# Patient Record
Sex: Male | Born: 2000 | Race: Black or African American | Hispanic: No | Marital: Single | Smoking: Never smoker
Health system: Southern US, Community
[De-identification: ages and names within clinical notes are randomized; demographics above are authoritative.]

## PROBLEM LIST (undated history)

## (undated) DIAGNOSIS — R569 Unspecified convulsions: Secondary | ICD-10-CM

## (undated) HISTORY — DX: Unspecified convulsions: R56.9

---
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
## 2020-01-08 ENCOUNTER — Ambulatory Visit
Admission: EM | Admit: 2020-01-08 | Discharge: 2020-01-08 | Disposition: A | Discharge status: Home / Self Care | Payer: Medicaid Other | Attending: Physician Assistant | Admitting: Physician Assistant

## 2020-01-08 DIAGNOSIS — J020 Streptococcal pharyngitis: Secondary | ICD-10-CM | POA: Diagnosis not present

## 2020-01-08 DIAGNOSIS — J039 Acute tonsillitis, unspecified: Secondary | ICD-10-CM | POA: Diagnosis present

## 2020-01-08 LAB — GROUP A STREP BY PCR: Group A Strep by PCR: DETECTED — AB

## 2020-01-08 LAB — MONONUCLEOSIS SCREEN: Mono Screen: NEGATIVE

## 2020-01-08 MED ORDER — CLARITHROMYCIN 250 MG PO TABS: 250.0000 mg | ORAL_TABLET | Freq: Two times a day (BID) | ORAL | 0 refills | Status: AC

## 2020-01-08 NOTE — ED Provider Notes (Signed)
[facility]    CSN: [id number] Arrival date & time: 01/08/20  1430      History   Chief Complaint Chief Complaint  Patient presents with  . Sore Throat    HPI [name] [name] is a 19 y.o. male.   19 year old male presents for 2-week history of sore throat.  He was seen in the office 13 days ago and had a positive strep test.  Due to his multiple allergies including Keflex, amoxicillin, penicillins, and clindamycin he was placed on azithromycin.  He says he took the medication for 5 days and symptoms did get a lot better, but seemed to never completely go away and then worsened starting yesterday. He says symptoms are not as bad right now as they were 2 weeks ago.  He denies any associated fever, fatigue, body aches, cough, congestion, chest discomfort, shortness of breath, smell or taste changes.  He denies any exposure to Covid.  He denies any other concerns today.     Past Medical History:  Diagnosis Date  . Seizures [facility]     Patient Active Problem List   Diagnosis Date Noted  . 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 Encounter for long-term (current) use of other medications 10/22/2013    Past Surgical History:  Procedure Laterality Date  . BONE MARROW BIOPSY  2014   [facility] Medications    Prior to Admission medications   Medication Sig Start Date End Date Taking? Authorizing Provider  divalproex (DEPAKOTE ER) 500 MG 24 hr tablet Take 1 tablet (500 mg total) by mouth at bedtime. 03/18/15  Yes Rockwell Germany, NP  azithromycin (ZITHROMAX) 250 MG tablet 2 tablets on day 1, then 1 tablet daily on days 2-5. 12/26/19   Coral Spikes, DO  clarithromycin (BIAXIN) 250 MG tablet Take 1 tablet (250 mg total) by mouth 2 (two) times daily for 10 days. 01/08/20 01/18/20  Danton Clap, PA-C    Family History Family History  Problem Relation Age of Onset  . Liver disease Paternal  Grandfather        Died at 59  . Seizures Maternal Uncle        Maternal great uncle    Social History Social History   Tobacco Use  . Smoking status: Never Smoker  . Smokeless tobacco: Never Used  Vaping Use  . Vaping Use: Every day  Substance Use Topics  . Alcohol use: No  . Drug use: No     Allergies   Cephalexin, Clindamycin, Amoxicillin, and Penicillins   Review of Systems Review of Systems  Constitutional: Negative for fever.  HENT: Positive for sore throat. Negative for congestion, ear pain, mouth sores, postnasal drip, rhinorrhea, trouble swallowing and voice change.   Respiratory: Negative for cough and wheezing.   Gastrointestinal: Negative for abdominal pain, nausea and vomiting.  Musculoskeletal: Negative for myalgias.  Skin: Negative for rash.  Neurological: Negative for weakness, light-headedness and headaches.  Hematological: Positive for adenopathy.     Physical Exam Triage Vital Signs ED Triage Vitals  Enc Vitals Group     BP 01/08/20 1436 118/82     Pulse Rate 01/08/20 1436 74     Resp 01/08/20 1436 16     Temp 01/08/20 1436 98.1 F (36.7 C)     Temp Source 01/08/20 1436 Oral     SpO2 01/08/20 1436 100 %  Weight 01/08/20 1439 160 lb (72.6 kg)     Height 01/08/20 1439 5' 10"  (1.778 m)     Head Circumference --      Peak Flow --      Pain Score 01/08/20 1438 8     Pain Loc --      Pain Edu? --      Excl. in [place]? --    No data found.  Updated Vital Signs BP 118/82 (BP Location: Right Arm)   Pulse 74   Temp 98.1 F (36.7 C) (Oral)   Resp 16   Ht 5' 10"  (1.778 m)   Wt 160 lb (72.6 kg)   SpO2 100%   BMI 22.96 kg/m        Physical Exam Vitals and nursing note reviewed.  Constitutional:      General: He is not in acute distress.    Appearance: Normal appearance. He is well-developed and normal weight. He is not toxic-appearing.  HENT:     Head: Normocephalic and atraumatic.     Nose: Nose normal. No rhinorrhea.      Mouth/Throat:     Mouth: Mucous membranes are moist.     Pharynx: Posterior oropharyngeal erythema present. No oropharyngeal exudate.     Tonsils: No tonsillar exudate or tonsillar abscesses. 2+ on the right. 2+ on the left.  Eyes:     General: No scleral icterus.    Conjunctiva/sclera: Conjunctivae normal.  Cardiovascular:     Rate and Rhythm: Normal rate and regular rhythm.     Heart sounds: Normal heart sounds. No murmur heard.   Pulmonary:     Effort: Pulmonary effort is normal. No respiratory distress.     Breath sounds: Normal breath sounds.  Musculoskeletal:     Cervical back: Neck supple.  Lymphadenopathy:     Cervical: Cervical adenopathy (tender bilateral anterior nodes, tender right post cervical nodes) present.  Skin:    General: Skin is warm and dry.  Neurological:     General: No focal deficit present.     Mental Status: He is alert. Mental status is at baseline.     Motor: No weakness.     Gait: Gait normal.  Psychiatric:        Mood and Affect: Mood normal.        Behavior: Behavior normal.        Thought Content: Thought content normal.      UC Treatments / Results  Labs (all labs ordered are listed, but only abnormal results are displayed) Labs Reviewed  GROUP A STREP BY PCR - Abnormal; Notable for the following components:      Result Value   Group A Strep by PCR DETECTED (*)    All other components within normal limits  MONONUCLEOSIS SCREEN    EKG   Radiology No results found.  Procedures Procedures (including critical care time)  Medications Ordered in UC Medications - No data to display  Initial Impression / Assessment and Plan / UC Course  I have reviewed the triage vital signs and the nursing notes.  Pertinent labs & imaging results that were available during my care of the patient were reviewed by me and considered in my medical decision making (see chart for details).   Strep PCR positive today.  Mono test negative.  Patient  recently improved with azithromycin but symptoms did not fully resolve.  Due to his multiple allergies, the only option is to try another macrolide at this time.  Sent in  clarithromycin.  Discussed potential side effects.  Patient to follow-up as needed.  Final Clinical Impressions(s) / UC Diagnoses   Final diagnoses:  Strep pharyngitis  Tonsillitis   Discharge Instructions   None    ED Prescriptions    Medication Sig Dispense Auth. Provider   clarithromycin (BIAXIN) 250 MG tablet Take 1 tablet (250 mg total) by mouth 2 (two) times daily for 10 days. 20 tablet Gretta Cool     PDMP not reviewed this encounter.   Danton Clap, PA-C 01/08/20 1551

## 2020-01-08 NOTE — ED Triage Notes (Signed)
Patient in today for persistent sore throat. Patient states he was dx'd w/ strep on 12/26/2019. Patient states in the last couple days the sore throat has worsened.   Denies loss of taste or smell, chills, fever, or N/V.

## 2020-02-06 ENCOUNTER — Ambulatory Visit
Admission: EM | Admit: 2020-02-06 | Discharge: 2020-02-06 | Disposition: A | Discharge status: Home / Self Care | Payer: Medicaid Other | Attending: Internal Medicine | Admitting: Internal Medicine

## 2020-02-06 ENCOUNTER — Other Ambulatory Visit: Payer: Self-pay

## 2020-02-06 DIAGNOSIS — H6123 Impacted cerumen, bilateral: Secondary | ICD-10-CM | POA: Diagnosis not present

## 2020-02-06 NOTE — ED Triage Notes (Signed)
Pt reports having bila ear pain that began this morning. sts he can't hear out of R ear, and L ear hurts when swallowing (feels like a pop). No other concerns at this time.

## 2020-02-06 NOTE — ED Provider Notes (Signed)
[facility]    CSN: [id number] Arrival date & time: 02/06/20  1702      History   Chief Complaint Chief Complaint  Patient presents with  . Otalgia    HPI [name] [name] is a 19 y.o. male presents with bilateral decreased hearing since this am. He cant hear out the R ear and L ear hurts when he swallows. Has been feeling popping in the L. His nose has been congested since today. Has hx of cerumen impaction. Admits he uses Qtips.    Past Medical History:  Diagnosis Date  . Seizures ([facility])     Patient Active Problem List   Diagnosis Date Noted  . 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 Encounter for long-term (current) use of other medications 10/22/2013    Past Surgical History:  Procedure Laterality Date  . BONE MARROW BIOPSY  2014   [place]        Home Medications    Prior to Admission medications   Medication Sig Start Date End Date Taking? Authorizing Provider  azithromycin (ZITHROMAX) 250 MG tablet 2 tablets on day 1, then 1 tablet daily on days 2-5. 12/26/19   Cook, Jayce G, DO  divalproex (DEPAKOTE ER) 500 MG 24 hr tablet Take 1 tablet (500 mg total) by mouth at bedtime. 03/18/15   Goodpasture, Tina, NP    Family History Family History  Problem Relation Age of Onset  . Liver disease Paternal Grandfather        Died at 45  . Seizures Maternal Uncle        Maternal great uncle    Social History Social History   Tobacco Use  . Smoking status: Never Smoker  . Smokeless tobacco: Never Used  Vaping Use  . Vaping Use: Every day  Substance Use Topics  . Alcohol use: No  . Drug use: No     Allergies   Cephalexin, Clindamycin, Amoxicillin, and Penicillins   Review of Systems Review of Systems  Constitutional: Negative for appetite change, chills, diaphoresis, fatigue and fever.  HENT: Positive for congestion, hearing loss, postnasal drip, rhinorrhea and sneezing. Negative  for ear discharge and ear pain.        Onset of sneezing and nose congestion today when he came back from camping  Eyes: Negative for discharge.  Respiratory: Negative for cough and shortness of breath.   Musculoskeletal: Negative for gait problem.  Skin: Negative for rash.     Physical Exam Triage Vital Signs ED Triage Vitals  Enc Vitals Group     BP 02/06/20 1749 118/80     Pulse Rate 02/06/20 1749 65     Resp 02/06/20 1749 18     Temp 02/06/20 1749 98.3 F (36.8 C)     Temp src --      SpO2 02/06/20 1749 98 %     Weight 02/06/20 1750 160 lb (72.6 kg)     Height 02/06/20 1750 5' 10" (1.778 m)     Head Circumference --      Peak Flow --      Pain Score 02/06/20 1750 5     Pain Loc --      Pain Edu? --      Excl. in [place]? --    No data found.  Updated Vital Signs BP 118/80   Pulse 65   Temp 98.3 F (36.8 C)   Resp 18   Ht 5' 10" (  1.778 m)   Wt 160 lb (72.6 kg)   SpO2 98%   BMI 22.96 kg/m   Visual Acuity Right Eye Distance:   Left Eye Distance:   Bilateral Distance:    Right Eye Near:   Left Eye Near:    Bilateral Near:     Physical Exam Vitals and nursing note reviewed.  Constitutional:      General: He is not in acute distress.    Appearance: He is normal weight. He is not toxic-appearing.  HENT:     Right Ear: There is impacted cerumen.     Left Ear: There is impacted cerumen.  Eyes:     General: No scleral icterus.    Conjunctiva/sclera: Conjunctivae normal.  Pulmonary:     Effort: Pulmonary effort is normal.  Musculoskeletal:        General: Normal range of motion.     Cervical back: Neck supple.  Lymphadenopathy:     Cervical: No cervical adenopathy.  Skin:    General: Skin is warm and dry.  Neurological:     Mental Status: He is alert and oriented to person, place, and time.     Gait: Gait normal.  Psychiatric:        Mood and Affect: Mood normal.        Behavior: Behavior normal.        Thought Content: Thought content normal.         Judgment: Judgment normal.      UC Treatments / Results  Labs (all labs ordered are listed, but only abnormal results are displayed) Labs Reviewed - No data to display  EKG   Radiology No results found.  Procedures Procedures (including critical care time)  Medications Ordered in UC Medications - No data to display  Initial Impression / Assessment and Plan / UC Course  I have reviewed the triage vital signs and the nursing notes. Lavage was attempted of both ears, but no success in getting the wax out. I tried using the curette, but the wax is too hard and I could not remove any of it.  Advised to get debrox gtts to soak it daily for 7 days or til he can see pcp or ENT for lavage.  Final Clinical Impressions(s) / UC Diagnoses   Final diagnoses:  None   Discharge Instructions   None    ED Prescriptions    None     PDMP not reviewed this encounter.   Rodriguez-Southworth, Sylvia, PA-C 02/06/20 2029  

## 2020-02-06 NOTE — Discharge Instructions (Addendum)
Get drops called debrox and apply about 3 drops in each ear and soak it for 15 minutes twice a day until you can get your ear washed again.

## 2020-02-07 ENCOUNTER — Emergency Department
Admission: EM | Admit: 2020-02-07 | Discharge: 2020-02-07 | Disposition: A | Discharge status: Home / Self Care | Payer: Medicaid Other | Attending: Emergency Medicine | Admitting: Emergency Medicine

## 2020-02-07 ENCOUNTER — Encounter: Payer: Self-pay | Admitting: Emergency Medicine

## 2020-02-07 DIAGNOSIS — H9203 Otalgia, bilateral: Secondary | ICD-10-CM | POA: Diagnosis present

## 2020-02-07 DIAGNOSIS — H6123 Impacted cerumen, bilateral: Secondary | ICD-10-CM | POA: Diagnosis not present

## 2020-02-07 MED ORDER — CARBAMIDE PEROXIDE 6.5 % OT SOLN
5.0000 [drp] | Freq: Once | OTIC | Status: DC
  Filled 2020-02-07: qty 15

## 2020-02-07 MED ORDER — SODIUM CHLORIDE 0.9 % IR SOLN
Freq: Once | Status: DC
  Filled 2020-02-07: qty 250

## 2020-02-07 NOTE — ED Triage Notes (Signed)
Pt to ED via POV stating that he is having pain in his right ear. Pt states that he has had the pain x 2-3 days. Pt went to urgent care yesterday and they told him that he had something in his ear but they were unable to get it out. Pt states that he is unable to hear out of his ear. Pt is in NAD.

## 2020-02-07 NOTE — ED Provider Notes (Signed)
[facility] Emergency Department Provider Note ____________________________________________  Time seen: 1520  I have reviewed the triage vital signs and the nursing notes.  HISTORY  Chief Complaint  Otalgia  HPI [name] [name] is a 19 y.o. male presents himself to the ED for evaluation of ongoing decreased hearing bilaterally, with a history of cerumen impaction.  Patient was evaluated in the urgent care yesterday, and underwent an unsuccessful attempt at ear irrigation.  He denies any fever, chills, sweats, dizziness, vertigo, tinnitus, or hearing loss.   Past Medical History:  Diagnosis Date  . Seizures [facility])     Patient Active Problem List   Diagnosis Date Noted  . 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 Encounter for long-term (current) use of other medications 10/22/2013    Past Surgical History:  Procedure Laterality Date  . BONE MARROW BIOPSY  2014   [employer]     Prior to Admission medications   Medication Sig Start Date End Date Taking? Authorizing Provider  divalproex (DEPAKOTE ER) 500 MG 24 hr tablet Take 1 tablet (500 mg total) by mouth at bedtime. 03/18/15   Rockwell Germany, NP    Allergies Cephalexin, Clindamycin, Amoxicillin, and Penicillins  Family History  Problem Relation Age of Onset  . Liver disease Paternal Grandfather        Died at 35  . Seizures Maternal Uncle        Maternal great uncle    Social History Social History   Tobacco Use  . Smoking status: Never Smoker  . Smokeless tobacco: Never Used  Vaping Use  . Vaping Use: Every day  Substance Use Topics  . Alcohol use: No  . Drug use: No    Review of Systems  Constitutional: Negative for fever. Eyes: Negative for visual changes. ENT: Negative for sore throat. Bilateral ear wax impaction Cardiovascular: Negative for chest pain. Respiratory: Negative for shortness of breath. Musculoskeletal:  Negative for back pain. Skin: Negative for rash. Neurological: Negative for headaches, focal weakness or numbness. ____________________________________________  PHYSICAL EXAM:  VITAL SIGNS: ED Triage Vitals  Enc Vitals Group     BP 02/07/20 1329 109/66     Pulse Rate 02/07/20 1329 71     Resp 02/07/20 1329 15     Temp 02/07/20 1329 98.2 F (36.8 C)     Temp Source 02/07/20 1329 Oral     SpO2 02/07/20 1329 100 %     Weight --      Height --      Head Circumference --      Peak Flow --      Pain Score 02/07/20 1334 2     Pain Loc --      Pain Edu? --      Excl. in [place]? --     Constitutional: Alert and oriented. Well appearing and in no distress. Head: Normocephalic and atraumatic. Eyes: Conjunctivae are normal. Normal extraocular movements Ears: Canals clear. TMs obscured bilaterally by soft brown wax impaction Cardiovascular: Normal rate, regular rhythm. Normal distal pulses. Respiratory: Normal respiratory effort.  Musculoskeletal: Nontender with normal range of motion in all extremities.  Neurologic:  Normal gait without ataxia. Normal speech and language. No gross focal neurologic deficits are appreciated. Skin:  Skin is warm, dry and intact. No rash noted. ____________________________________________  PROCEDURES  Murine Ear Wax Removal solution - AU  .Ear Cerumen Removal  Date/Time: 02/07/2020 3:28 PM Performed by: Melvenia Needles,  PA-C Authorized by: Melvenia Needles, PA-C   Consent:    Consent obtained:  Verbal   Consent given by:  Patient   Risks discussed:  Pain and TM perforation   Alternatives discussed:  Alternative treatment Procedure details:    Location:  L ear and R ear   Procedure type: irrigation   Post-procedure details:    Inspection:  TM intact and macerated skin   Hearing quality:  Improved   Patient tolerance of procedure:  Tolerated well, no immediate complications Comments:     Mix 1:1 H202: warm  H20   ____________________________________________  INITIAL IMPRESSION / ASSESSMENT AND PLAN / ED COURSE  Patient with ED patient with ED evaluation of bilateral otalgia and hearing loss secondary to cerumen impaction.  Patient was treated with an ear irrigation procedure, and successful removal of of wax plugs was achieved.  He is discharged at this time with instructions to continue to use Debrox carbamide carbamide peroxide solution.  He will follow-up with primary provider return to the ED as needed.  [name] [name] was evaluated in Emergency Department on 02/07/2020 for the symptoms described in the history of present illness. He was evaluated in the context of the global COVID-19 pandemic, which necessitated consideration that the patient might be at risk for infection with the SARS-CoV-2 virus that causes COVID-19. Institutional protocols and algorithms that pertain to the evaluation of patients at risk for COVID-19 are in a state of rapid change based on information released by regulatory bodies including the CDC and federal and state organizations. These policies and algorithms were followed during the patient's care in the ED. ____________________________________________  FINAL CLINICAL IMPRESSION(S) / ED DIAGNOSES  Final diagnoses:  Otalgia of both ears  Bilateral impacted cerumen      Carmie End, Dannielle Karvonen, PA-C 02/07/20 1532    Vladimir Crofts, MD 02/07/20 2357

## 2020-03-04 ENCOUNTER — Other Ambulatory Visit: Payer: Self-pay

## 2020-03-04 ENCOUNTER — Encounter: Payer: Self-pay | Admitting: Family Medicine

## 2020-03-04 ENCOUNTER — Ambulatory Visit: Payer: Medicaid Other | Admitting: Family Medicine

## 2020-03-04 DIAGNOSIS — Z113 Encounter for screening for infections with a predominantly sexual mode of transmission: Secondary | ICD-10-CM | POA: Diagnosis not present

## 2020-03-04 LAB — GRAM STAIN

## 2020-03-04 NOTE — Progress Notes (Signed)
Gram stain reviewed with provider and is negative today. No treatment needed for gram stain per standing order and per provider verbal order. Counseled pt per provider orders and pt states understanding. Provider orders completed.

## 2020-03-04 NOTE — Progress Notes (Signed)
   [facility] Department STI clinic/screening visit  Subjective:  [name] [name] is a 19 y.o. male being seen today for an STI screening visit. The patient reports they do have symptoms.    Patient has the following medical conditions:   Patient Active Problem List   Diagnosis Date Noted  . 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 Encounter for long-term (current) use of other medications 10/22/2013     No chief complaint on file.   HPI  Patient reports client states that he has a small lesion on his circumcision area since a tick bite when he was younger.  While having sex with his partner on 10/31 the lesion became painful. Client states that he stopped having sex and noted that the lesion looked scratched.  No other symptoms.  Denies partner has symptoms.   See flowsheet for further details and programmatic requirements.    The following portions of the patient's history were reviewed and updated as appropriate: allergies, current medications, past medical history, past social history, past surgical history and problem list.  Objective:  There were no vitals filed for this visit.  Physical Exam Constitutional:      Appearance: Normal appearance.  HENT:     Head: Normocephalic and atraumatic.     Comments: No nits or hair loss    Mouth/Throat:     Mouth: Mucous membranes are moist.     Pharynx: Oropharynx is clear. No oropharyngeal exudate or posterior oropharyngeal erythema.  Pulmonary:     Effort: Pulmonary effort is normal.  Abdominal:     General: Abdomen is flat.     Palpations: Abdomen is soft. There is no hepatomegaly or mass.     Tenderness: There is no abdominal tenderness.  Genitourinary:    Pubic Area: No rash or pubic lice.      Penis: Circumcised. Tenderness, swelling and lesions present. No erythema or discharge.      Testes: Normal.     Epididymis:     Right: Normal.     Left:  Normal.     Comments: Small abrasion located posterior shaft, no discharge, slight tenderness with palpation Lymphadenopathy:     Head:     Right side of head: No preauricular or posterior auricular adenopathy.     Left side of head: No preauricular or posterior auricular adenopathy.     Cervical: No cervical adenopathy.     Upper Body:     Right upper body: No supraclavicular or axillary adenopathy.     Left upper body: No supraclavicular or axillary adenopathy.     Lower Body: No right inguinal adenopathy. No left inguinal adenopathy.  Skin:    General: Skin is warm and dry.     Findings: No rash.  Neurological:     Mental Status: He is alert and oriented to person, place, and time.     Assessment and Plan:  [name] [name] is a 19 y.o. male presenting to the [facility] Department for STI screening  1. Screening examination for venereal disease  - Gram stain - Gonococcus culture - HIV [place] [facility] - Syphilis Serology, [place] [facility] - Gonococcus culture - Virology, state lab R/O HSV- client will wait for results. Co to abstain from sex until lesion has healed Co. To use condoms for STD and pregnancy prevention. Offered condoms.   No follow-ups on file.  No future appointments.  Larene Pickett, FNP

## 2020-03-10 LAB — GONOCOCCUS CULTURE

## 2021-01-12 DIAGNOSIS — Z79899 Other long term (current) drug therapy: Secondary | ICD-10-CM | POA: Diagnosis not present

## 2021-01-12 DIAGNOSIS — R519 Headache, unspecified: Secondary | ICD-10-CM | POA: Diagnosis not present

## 2021-01-12 DIAGNOSIS — Z880 Allergy status to penicillin: Secondary | ICD-10-CM | POA: Diagnosis not present

## 2021-01-12 DIAGNOSIS — G40909 Epilepsy, unspecified, not intractable, without status epilepticus: Secondary | ICD-10-CM | POA: Diagnosis not present

## 2021-01-12 DIAGNOSIS — R4589 Other symptoms and signs involving emotional state: Secondary | ICD-10-CM | POA: Diagnosis not present

## 2021-01-12 DIAGNOSIS — F32A Depression, unspecified: Secondary | ICD-10-CM | POA: Diagnosis not present

## 2021-01-20 DIAGNOSIS — G40B09 Juvenile myoclonic epilepsy, not intractable, without status epilepticus: Secondary | ICD-10-CM | POA: Diagnosis not present

## 2021-01-20 DIAGNOSIS — G40B11 Juvenile myoclonic epilepsy, intractable, with status epilepticus: Secondary | ICD-10-CM | POA: Diagnosis not present
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
## 2021-04-16 ENCOUNTER — Encounter: Payer: Self-pay | Admitting: Emergency Medicine

## 2021-04-16 ENCOUNTER — Ambulatory Visit
Admission: EM | Admit: 2021-04-16 | Discharge: 2021-04-16 | Disposition: A | Discharge status: Home / Self Care | Payer: Medicaid Other | Attending: Emergency Medicine | Admitting: Emergency Medicine

## 2021-04-16 ENCOUNTER — Other Ambulatory Visit: Payer: Self-pay

## 2021-04-16 DIAGNOSIS — J09X2 Influenza due to identified novel influenza A virus with other respiratory manifestations: Secondary | ICD-10-CM | POA: Insufficient documentation

## 2021-04-16 DIAGNOSIS — Z20822 Contact with and (suspected) exposure to covid-19: Secondary | ICD-10-CM | POA: Diagnosis not present

## 2021-04-16 DIAGNOSIS — J020 Streptococcal pharyngitis: Secondary | ICD-10-CM | POA: Diagnosis not present

## 2021-04-16 LAB — RESP PANEL BY RT-PCR (FLU A&B, COVID) ARPGX2
Influenza A by PCR: POSITIVE — AB
Influenza B by PCR: NEGATIVE
SARS Coronavirus 2 by RT PCR: NEGATIVE

## 2021-04-16 LAB — GROUP A STREP BY PCR: Group A Strep by PCR: DETECTED — AB

## 2021-04-16 MED ORDER — BENZONATATE 100 MG PO CAPS: 200.0000 mg | ORAL_CAPSULE | Freq: Three times a day (TID) | ORAL | 0 refills | Status: AC

## 2021-04-16 MED ORDER — AZITHROMYCIN 250 MG PO TABS: 250.0000 mg | ORAL_TABLET | Freq: Every day | ORAL | 0 refills | Status: AC

## 2021-04-16 MED ORDER — IPRATROPIUM BROMIDE 0.06 % NA SOLN: 2.0000 | Freq: Four times a day (QID) | NASAL | 12 refills | Status: DC

## 2021-04-16 MED ORDER — OSELTAMIVIR PHOSPHATE 75 MG PO CAPS: 75.0000 mg | ORAL_CAPSULE | Freq: Two times a day (BID) | ORAL | 0 refills | Status: AC

## 2021-04-16 MED ORDER — PROMETHAZINE-DM 6.25-15 MG/5ML PO SYRP: 5.0000 mL | ORAL_SOLUTION | Freq: Four times a day (QID) | ORAL | 0 refills | Status: AC | PRN

## 2021-04-16 NOTE — Discharge Instructions (Signed)
Take the Azithromycin daily for 5 days for treatment of your strep throat.  Gargle with warm salt water 2-3 times a day to soothe your throat, aid in pain relief, and aid in healing.  Take over-the-counter ibuprofen according to the package instructions as needed for pain.  You can also use Chloraseptic or Sucrets lozenges, 1 lozenge every 2 hours as needed for throat pain.  Take the Tamiflu twice daily for 5 days for treatment of influenza.  Use the Atrovent nasal spray, 2 squirts up each nostril every 6 hours, as needed for nasal congestion and runny nose.  Use over-the-counter Delsym, Zarbee's, or Robitussin during the day as needed for cough.  Use the Tessalon Perles every 8 hours as needed for cough.  Taken with a small sip of water.  You may experience some numbness to your tongue or metallic taste in her mouth, this is normal.  Use the Promethazine DM cough syrup at bedtime as will make you drowsy but it should help dry up your postnasal drip and aid you in sleep and cough relief.  Return for reevaluation, or see your primary care provider, for new or worsening symptoms.

## 2021-04-16 NOTE — ED Triage Notes (Signed)
Patient c/o slight sore throat, nasal congestion and headache that started last night.  Patient unsure of fevers.

## 2021-04-16 NOTE — ED Provider Notes (Signed)
[facility]    CSN: [id number] Arrival date & time: 04/16/21  1419      History   Chief Complaint Chief Complaint  Patient presents with   Sore Throat   Nasal Congestion    HPI [name] [name] is a 20 y.o. male.   HPI  20 year old male here for evaluation of respiratory complaints.  Patient reports that he has been experiencing headache, nasal congestion with clear nasal discharge, ear pressure, and sore throat that started last night.  He denies any fever, cough, shortness breath or wheezing, or GI complaints.  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 Authorizing Provider  azithromycin (ZITHROMAX Z-PAK) 250 MG tablet Take 1 tablet (250 mg total) by mouth daily. Take 2 tablets on the first day and then 1 tablet daily thereafter for a total of 5 days of treatment. 04/16/21  Yes Margarette Canada, NP  benzonatate (TESSALON) 100 MG capsule Take 2 capsules (200 mg total) by mouth every 8 (eight) hours. 04/16/21  Yes Margarette Canada, NP  divalproex (DEPAKOTE ER) 500 MG 24 hr tablet Take 1 tablet (500 mg total) by mouth at bedtime. 03/18/15  Yes Rockwell Germany, NP  ipratropium (ATROVENT) 0.06 % nasal spray Place 2 sprays into both nostrils 4 (four) times daily. 04/16/21  Yes Margarette Canada, NP  oseltamivir (TAMIFLU) 75 MG capsule Take 1 capsule (75 mg total) by mouth every 12 (twelve) hours. 04/16/21  Yes Margarette Canada, NP  promethazine-dextromethorphan (PROMETHAZINE-DM) 6.25-15 MG/5ML syrup Take 5 mLs by mouth 4 (four) times daily as  needed. 04/16/21  Yes Margarette Canada, NP    Family History Family History  Problem Relation Age of Onset   Liver disease Paternal Grandfather        Died at 24   Seizures Maternal Uncle        Maternal great uncle    Social History Social History   Tobacco Use   Smoking status: Never   Smokeless tobacco: Never   Tobacco comments:    doesn't smoke cigarrettes  Vaping Use   Vaping Use: Every day  Substance Use Topics   Alcohol use: No   Drug use: Yes    Types: Marijuana    Comment: occas.social use     Allergies   Cephalexin, Clindamycin, Amoxicillin, and Penicillins   Review of Systems Review of Systems  Constitutional:  Negative for activity change, appetite change and fever.  HENT:  Positive for congestion, ear pain, rhinorrhea and sore throat.   Respiratory:  Negative for cough, shortness of breath and wheezing.   Gastrointestinal:  Negative for diarrhea, nausea and vomiting.  Musculoskeletal:  Negative for arthralgias and myalgias.  Skin:  Negative for rash.  Neurological:  Positive for headaches.  Hematological: Negative.   Psychiatric/Behavioral: Negative.      Physical Exam Triage Vital Signs ED Triage Vitals  Enc Vitals Group     BP 04/16/21 1459 122/74     Pulse Rate 04/16/21 1459 85  Resp 04/16/21 1459 15     Temp 04/16/21 1459 98.1 F (36.7 C)     Temp Source 04/16/21 1459 Oral     SpO2 04/16/21 1459 100 %     Weight 04/16/21 1456 180 lb (81.6 kg)     Height 04/16/21 1456 5' 10"  (1.778 m)     Head Circumference --      Peak Flow --      Pain Score 04/16/21 1456 3     Pain Loc --      Pain Edu? --      Excl. in [place]? --    No data found.  Updated Vital Signs BP 122/74 (BP Location: Left Arm)    Pulse 85    Temp 98.1 F (36.7 C) (Oral)    Resp 15    Ht 5' 10"  (1.778 m)    Wt 180 lb (81.6 kg)    SpO2 100%    BMI 25.83 kg/m   Visual Acuity Right Eye Distance:   Left Eye Distance:   Bilateral Distance:    Right Eye Near:   Left Eye  Near:    Bilateral Near:     Physical Exam   UC Treatments / Results  Labs (all labs ordered are listed, but only abnormal results are displayed) Labs Reviewed  GROUP A STREP BY PCR - Abnormal; Notable for the following components:      Result Value   Group A Strep by PCR DETECTED (*)    All other components within normal limits  RESP PANEL BY RT-PCR (FLU A&B, COVID) ARPGX2 - Abnormal; Notable for the following components:   Influenza A by PCR POSITIVE (*)    All other components within normal limits    EKG   Radiology No results found.  Procedures Procedures (including critical care time)  Medications Ordered in UC Medications - No data to display  Initial Impression / Assessment and Plan / UC Course  I have reviewed the triage vital signs and the nursing notes.  Pertinent labs & imaging results that were available during my care of the patient were reviewed by me and considered in my medical decision making (see chart for details).  Patient is a nontoxic-appearing 20 year old male here for evaluation of respiratory complaints as outlined in HPI above.  His most prominent complaint is a sore throat.  All of his symptoms started last night.  On physical exam patient has pearly-gray tympanic membranes bilaterally with normal light reflex and clear external auditory canals.  Nasal mucosa is mildly edematous and erythematous with scant clear nasal discharge.  Tonsillar pillars are 2+ edematous with mild erythema but no exudate.  No cervical lymphadenopathy appreciated exam.  Cardiopulmonary exam reveals clear lung sounds in all fields.  Strep PCR and respiratory triplex panel were collected at triage.  Patient is positive for strep and influenza a.  I will discharge the patient home on Tamiflu twice daily for 5 days for treatment of influenza a and discharged him on a Z-Pak for treatment of the strep as he is allergic to cephalosporins and penicillins.  We will also give Atrovent  nasal spray to help with nasal congestion and runny nose.  I have sent a prescription for Tessalon Perles and Promethazine DM cough syrup to the pharmacy for patient should he develop cough symptoms which she does not have at present.  Work note provided.   Final Clinical Impressions(s) / UC Diagnoses   Final diagnoses:  Influenza due to identified novel  influenza A virus with other respiratory manifestations  Streptococcal sore throat     Discharge Instructions      Take the Azithromycin daily for 5 days for treatment of your strep throat.  Gargle with warm salt water 2-3 times a day to soothe your throat, aid in pain relief, and aid in healing.  Take over-the-counter ibuprofen according to the package instructions as needed for pain.  You can also use Chloraseptic or Sucrets lozenges, 1 lozenge every 2 hours as needed for throat pain.  Take the Tamiflu twice daily for 5 days for treatment of influenza.  Use the Atrovent nasal spray, 2 squirts up each nostril every 6 hours, as needed for nasal congestion and runny nose.  Use over-the-counter Delsym, Zarbee's, or Robitussin during the day as needed for cough.  Use the Tessalon Perles every 8 hours as needed for cough.  Taken with a small sip of water.  You may experience some numbness to your tongue or metallic taste in her mouth, this is normal.  Use the Promethazine DM cough syrup at bedtime as will make you drowsy but it should help dry up your postnasal drip and aid you in sleep and cough relief.  Return for reevaluation, or see your primary care provider, for new or worsening symptoms.      ED Prescriptions     Medication Sig Dispense Auth. Provider   azithromycin (ZITHROMAX Z-PAK) 250 MG tablet Take 1 tablet (250 mg total) by mouth daily. Take 2 tablets on the first day and then 1 tablet daily thereafter for a total of 5 days of treatment. 6 tablet Margarette Canada, NP   oseltamivir (TAMIFLU) 75 MG capsule Take 1 capsule  (75 mg total) by mouth every 12 (twelve) hours. 10 capsule Margarette Canada, NP   benzonatate (TESSALON) 100 MG capsule Take 2 capsules (200 mg total) by mouth every 8 (eight) hours. 21 capsule Margarette Canada, NP   ipratropium (ATROVENT) 0.06 % nasal spray Place 2 sprays into both nostrils 4 (four) times daily. 15 mL Margarette Canada, NP   promethazine-dextromethorphan (PROMETHAZINE-DM) 6.25-15 MG/5ML syrup Take 5 mLs by mouth 4 (four) times daily as needed. 118 mL Margarette Canada, NP      PDMP not reviewed this encounter.   Margarette Canada, NP 04/16/21 1601

## 2021-06-11 ENCOUNTER — Other Ambulatory Visit: Payer: Self-pay

## 2021-06-11 ENCOUNTER — Ambulatory Visit
Admission: EM | Admit: 2021-06-11 | Discharge: 2021-06-11 | Disposition: A | Discharge status: Home / Self Care | Payer: Medicaid Other | Attending: Emergency Medicine | Admitting: Emergency Medicine

## 2021-06-11 DIAGNOSIS — J069 Acute upper respiratory infection, unspecified: Secondary | ICD-10-CM | POA: Insufficient documentation

## 2021-06-11 LAB — GROUP A STREP BY PCR: Group A Strep by PCR: NOT DETECTED

## 2021-06-11 MED ORDER — IPRATROPIUM BROMIDE 0.06 % NA SOLN: 2.0000 | Freq: Four times a day (QID) | NASAL | 12 refills | Status: AC

## 2021-06-11 NOTE — Discharge Instructions (Signed)
Your strep test was negative,  I believe that your scratchy throat is coming from postnasal drip.  Use the Atrovent nasal spray, 2 squirts in each nostril every 6 hours, as needed for runny nose and postnasal drip.  Return for reevaluation or see your primary care provider for any new or worsening symptoms.

## 2021-06-11 NOTE — ED Provider Notes (Signed)
[facility]    CSN: [id number] Arrival date & time: 06/11/21  1617      History   Chief Complaint Chief Complaint  Patient presents with   Sore Throat   Nasal Congestion    HPI [name] [name] is a 21 y.o. male.   HPI  21 year old male here for evaluation of sore throat.  Patient reports that he has been experiencing a scratchy throat since yesterday.  This is in association with runny nose and a slight headache.  He denies any fever, ear pain, cough, shortness of breath or wheezing, or GI complaints.  He states that he is here because he wants to make sure that he is healthy for his upcoming [place] party at his house.  Patient is a smoker but states that smoking does not increase his scratchiness in his throat.  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 Authorizing Provider  divalproex (DEPAKOTE ER) 500 MG 24 hr tablet Take 1 tablet (500 mg total) by mouth at bedtime. 03/18/15  Yes Rockwell Germany, NP  ipratropium (ATROVENT) 0.06 % nasal spray Place 2 sprays into both nostrils 4 (four) times daily. 06/11/21  Yes Margarette Canada, NP  azithromycin (ZITHROMAX Z-PAK) 250 MG tablet Take 1 tablet (250 mg total) by mouth daily. Take 2 tablets on the first day and then 1 tablet daily thereafter for a total of 5 days of treatment. 04/16/21   Margarette Canada, NP  benzonatate (TESSALON) 100 MG capsule Take 2 capsules (200 mg total) by mouth every 8 (eight) hours. 04/16/21   Margarette Canada, NP  oseltamivir (TAMIFLU) 75 MG capsule Take 1  capsule (75 mg total) by mouth every 12 (twelve) hours. 04/16/21   Margarette Canada, NP  promethazine-dextromethorphan (PROMETHAZINE-DM) 6.25-15 MG/5ML syrup Take 5 mLs by mouth 4 (four) times daily as needed. 04/16/21   Margarette Canada, NP    Family History Family History  Problem Relation Age of Onset   Liver disease Paternal Grandfather        Died at 86   Seizures Maternal Uncle        Maternal great uncle    Social History Social History   Tobacco Use   Smoking status: Never   Smokeless tobacco: Never   Tobacco comments:    doesn't smoke cigarrettes  Vaping Use   Vaping Use: Every day  Substance Use Topics   Alcohol use: No   Drug use: Yes    Types: Marijuana    Comment: occas.social use     Allergies   Cephalexin, Clindamycin, Amoxicillin, and Penicillins   Review of Systems Review of Systems  Constitutional:  Negative for activity change, appetite change and fever.  HENT:  Positive for congestion, postnasal drip, rhinorrhea and sore throat. Negative for ear pain.   Respiratory:  Negative for cough, shortness of breath and wheezing.   Gastrointestinal:  Negative for diarrhea, nausea and vomiting.  Skin:  Negative for rash.  Neurological:  Positive for headaches.  Hematological:  Negative.   Psychiatric/Behavioral: Negative.      Physical Exam Triage Vital Signs ED Triage Vitals  Enc Vitals Group     BP 06/11/21 1631 113/72     Pulse Rate 06/11/21 1631 79     Resp 06/11/21 1631 18     Temp 06/11/21 1631 98.7 F (37.1 C)     Temp Source 06/11/21 1631 Oral     SpO2 06/11/21 1631 97 %     Weight 06/11/21 1633 190 lb (86.2 kg)     Height 06/11/21 1633 5' 10"  (1.778 m)     Head Circumference --      Peak Flow --      Pain Score 06/11/21 1631 3     Pain Loc --      Pain Edu? --      Excl. in [place]? --    No data found.  Updated Vital Signs BP 113/72 (BP Location: Left Arm)    Pulse 79    Temp 98.7 F (37.1 C) (Oral)    Resp 18    Ht 5' 10"  (1.778 m)    Wt  190 lb (86.2 kg)    SpO2 97%    BMI 27.26 kg/m   Visual Acuity Right Eye Distance:   Left Eye Distance:   Bilateral Distance:    Right Eye Near:   Left Eye Near:    Bilateral Near:     Physical Exam Vitals and nursing note reviewed.  Constitutional:      Appearance: Normal appearance. He is not ill-appearing.  HENT:     Head: Normocephalic and atraumatic.     Right Ear: Tympanic membrane, ear canal and external ear normal. There is no impacted cerumen.     Left Ear: Tympanic membrane, ear canal and external ear normal. There is no impacted cerumen.     Nose: Congestion and rhinorrhea present.     Mouth/Throat:     Mouth: Mucous membranes are moist.     Pharynx: Oropharynx is clear. Posterior oropharyngeal erythema present.  Cardiovascular:     Rate and Rhythm: Normal rate and regular rhythm.     Pulses: Normal pulses.     Heart sounds: Normal heart sounds. No murmur heard.   No friction rub. No gallop.  Pulmonary:     Effort: Pulmonary effort is normal.     Breath sounds: Normal breath sounds. No wheezing, rhonchi or rales.  Musculoskeletal:     Cervical back: Normal range of motion and neck supple.  Lymphadenopathy:     Cervical: No cervical adenopathy.  Skin:    General: Skin is warm and dry.     Capillary Refill: Capillary refill takes less than 2 seconds.     Findings: No erythema or rash.  Neurological:     General: No focal deficit present.     Mental Status: He is alert and oriented to person, place, and time.  Psychiatric:        Mood and Affect: Mood normal.        Behavior: Behavior normal.        Thought Content: Thought content normal.        Judgment: Judgment normal.     UC Treatments / Results  Labs (all labs ordered are listed, but only abnormal results are displayed) Labs Reviewed  GROUP A STREP BY PCR    EKG   Radiology No results found.  Procedures Procedures (including critical care time)  Medications Ordered in UC Medications -  No data to display  Initial Impression / Assessment and Plan / UC Course  I have reviewed the triage vital signs and the nursing notes.  Pertinent labs & imaging results that were available during my care of the patient were reviewed by me and considered in my medical decision making (see chart for details).  Patient is a nontoxic-appearing 21 year old male here for evaluation of a scratchy throat with runny nose and headache that started yesterday.  Patient denies any fever, ear pain, cough, shortness of breath, wheezing, or GI complaints.  Patient's physical exam reveals pearly-gray tympanic membranes bilaterally with normal light reflex and clear external auditory canals.  Nasal mucosa is erythematous and edematous with clear discharge in both nares.  Oropharyngeal exam reveals mild posterior oropharyngeal erythema with clear postnasal drip.  No cervical lymphadenopathy appreciated exam.  Cardiopulmonary exam reveals clear lung sounds in all fields.  Strep PCR test was collected at triage and is negative.  Patient exam is consistent with an upper respiratory infection I believe postnasal drip is what is triggering his cough.  I will prescribe Atrovent nasal spray to help with nasal congestion and postnasal drip.  Patient can also perform salt water gargles to help wash away the drainage and soothe his throat as well as use over-the-counter Tylenol and ibuprofen.   Final Clinical Impressions(s) / UC Diagnoses   Final diagnoses:  Upper respiratory tract infection, unspecified type     Discharge Instructions      Your strep test was negative,  I believe that your scratchy throat is coming from postnasal drip.  Use the Atrovent nasal spray, 2 squirts in each nostril every 6 hours, as needed for runny nose and postnasal drip.  Return for reevaluation or see your primary care provider for any new or worsening symptoms.      ED Prescriptions     Medication Sig Dispense Auth. Provider    ipratropium (ATROVENT) 0.06 % nasal spray Place 2 sprays into both nostrils 4 (four) times daily. 15 mL Margarette Canada, NP      PDMP not reviewed this encounter.   Margarette Canada, NP 06/11/21 1730

## 2021-06-11 NOTE — ED Triage Notes (Signed)
Patient here for "s/t with runny nose". S/t started first yesterday, then runny nose following with ha. Throat feel's scratchy "not all the time". No cough. No sob. No new/unexplained rash.

## 2023-07-03 ENCOUNTER — Encounter: Payer: Self-pay | Admitting: Nurse Practitioner

## 2023-07-03 ENCOUNTER — Ambulatory Visit: Admitting: Nurse Practitioner

## 2023-07-03 DIAGNOSIS — Z113 Encounter for screening for infections with a predominantly sexual mode of transmission: Secondary | ICD-10-CM | POA: Diagnosis not present

## 2023-07-03 DIAGNOSIS — Z202 Contact with and (suspected) exposure to infections with a predominantly sexual mode of transmission: Secondary | ICD-10-CM

## 2023-07-03 LAB — HM HIV SCREENING LAB: HM HIV Screening: NEGATIVE

## 2023-07-03 LAB — HM HEPATITIS C SCREENING LAB: HM Hepatitis Screen: NEGATIVE

## 2023-07-03 LAB — HEPATITIS B SURFACE ANTIGEN: Hepatitis B Surface Ag: NONREACTIVE

## 2023-07-03 MED ORDER — GENTAMICIN SULFATE 40 MG/ML IJ SOLN
240.0000 mg | Freq: Once | INTRAMUSCULAR | Status: AC
  Administered 2023-07-03: 240 mg via INTRAMUSCULAR

## 2023-07-03 MED ORDER — DOXYCYCLINE HYCLATE 100 MG PO TABS: 100.0000 mg | ORAL_TABLET | Freq: Two times a day (BID) | ORAL | Status: AC

## 2023-07-03 MED ORDER — AZITHROMYCIN 500 MG PO TABS
2000.0000 mg | ORAL_TABLET | Freq: Once | ORAL | Status: AC
  Administered 2023-07-03: 2000 mg via ORAL

## 2023-07-04 NOTE — Progress Notes (Signed)
 [facility] Department STI clinic [address] [place] [address] Main phone: [phone]  STI screening visit  Subjective:  [name] [name] is a 23 y.o. male being seen today for an STI screening visit. The patient reports they do not have symptoms.    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 male who presents to the office as a contact to chlamydia and gonorrhea. He is requesting asymptomatic STI testing. Patient reports 2 male partners and last sex was 06/16/23. He reports practicing oral and penile/vaginal penetrative sex without condoms. Patient reports no history of STI.    STI screening history: Last HIV test per patient/review of record was No results found for: "HMHIVSCREEN" No results found for: "HIV"  Last HEPC test per patient/review of record was No results found for: "HMHEPCSCREEN" No components found for: "HEPC"   Last HEPB test per patient/review of record was No components found for: "HMHEPBSCREEN"   Fertility: Does the patient or their partner desires a pregnancy in the next year? No  Screening for MPX risk: Does the patient have an unexplained rash? No Is the patient MSM? No Does the patient endorse multiple sex partners or anonymous sex partners? Yes Did the patient have close or sexual contact with a person diagnosed with MPX? No Has the patient traveled outside the [place] where MPX is endemic? No Is there a high clinical suspicion for MPX-- evidenced by one of the following No  -Unlikely to be  chickenpox  -Lymphadenopathy  -Rash that present in same phase of evolution on any given body part   See flowsheet for further details and programmatic requirements.    There is no immunization history on file for this patient.   The following portions of the patient's history were reviewed and updated as appropriate: allergies, current medications, past medical history, past social history, past surgical history and problem list.  Objective:  There were no vitals filed for this visit.  Physical Exam Nursing note reviewed. Chaperone present: Chaperone declined.  Constitutional:      Appearance: Normal appearance.  HENT:     Head: Normocephalic.     Salivary Glands: Right salivary gland is not diffusely enlarged or tender. Left salivary gland is not diffusely enlarged or tender.     Mouth/Throat:     Lips: Pink. No lesions.     Mouth: Mucous membranes are moist. No oral lesions.     Tongue: No lesions. Tongue does not deviate from midline.     Pharynx: Oropharynx is clear. Uvula midline. No oropharyngeal exudate or posterior oropharyngeal erythema.     Tonsils: No tonsillar exudate.  Eyes:     General:        Right eye: No discharge.        Left eye: No discharge.     Conjunctiva/sclera:     Right eye: Right conjunctiva is not injected. No exudate.    Left eye: Left conjunctiva is not injected. No exudate. Pulmonary:     Effort: Pulmonary effort is normal.  Genitourinary:  Comments: Patient asymptomatic and declines genital exam.  Lymphadenopathy:     Head:     Right side of head: No submental, submandibular, tonsillar, preauricular or posterior auricular adenopathy.     Left side of head: No submental, submandibular, tonsillar, preauricular or posterior auricular adenopathy.     Cervical: No cervical adenopathy.     Right cervical: No superficial or posterior cervical adenopathy.    Left cervical: No superficial or posterior cervical adenopathy.     Upper Body:      Right upper body: No supraclavicular or axillary adenopathy.     Left upper body: No supraclavicular or axillary adenopathy.  Skin:    General: Skin is warm and dry.     Findings: No lesion or rash.     Comments: Skin tone appropriate for ethnicity. Assessed exposed areas and back only.   Neurological:     Mental Status: He is alert and oriented to person, place, and time.  Psychiatric:        Attention and Perception: Attention and perception normal.        Mood and Affect: Mood and affect normal.        Speech: Speech normal.        Behavior: Behavior normal. Behavior is cooperative.        Thought Content: Thought content normal.     Assessment and Plan:  [name] [name] is a 23 y.o. male presenting to the [facility] Department for STI screening  1. Screening for venereal disease  - Gonococcus culture - HBV Antigen/Antibody State Lab - HIV/HCV [place] [facility] - Syphilis Serology, [place] [facility] - Chlamydia/GC NAA, Confirmation  2. Gonorrhea contact (Primary) First-line treatment for gonorrhea is Ceftriaxone injection. Patient has allergy to cephalosporins that includes hives. This reaction was reported 11 years ago when patient was 23 years old. He is unable to recall many details of what happened. He called his mother who was able to state the hives began 1 day after taking medication and hives lasted for 1 week.  Second-line treatment includes oral azithromycin and gentamicin injection. Patient also has allergy listed to clindamycin which include hives, itching, and rash that was noted 05/2017. Patient does not recall this allergy nor does his mother. In reviewing the pts chart, he has been treated with Azithromycin on 04/16/21 and had no problems.  Consulted supervising physician (Dr. Fayette Pho) regarding safest treatment regimen for patient allergy. It was decided to treat with Gentamicin and Azithromycin d/t patient previously tolerating Azithromycin. Patient was  monitored in office for 30 minutes after medication administration. Patient had no ADE.   - gentamicin (GARAMYCIN) injection 240 mg - azithromycin (ZITHROMAX) tablet 2,000 mg  3. Chlamydia contact Treated per CDC guidelines. Patient has no reported allergy to tetracyclines.  - doxycycline (VIBRA-TABS) 100 MG tablet; Take 1 tablet (100 mg total) by mouth 2 (two) times daily for 7 days.   Patient does not have STI symptoms Patient accepted all screenings including  urine GC/Chlamydia, and blood work for HIV/Syphilis. Patient meets criteria for HepB screening? Yes. Ordered? yes Patient meets criteria for HepC screening? Yes. Ordered? yes Recommended condom use with all sex Discussed importance of condom use for STI prevention  Treat positive test results per standing order. Discussed time line for State Lab results and that patient will be called with positive results and encouraged patient to call if he had not heard in 2 weeks Recommended repeat testing in 3 months with positive results. Recommended  returning for continued or worsening symptoms.   Return if symptoms worsen or fail to improve.  No future appointments.  Total time with patient 45 minutes.   Edmonia James, NP

## 2023-07-08 LAB — CHLAMYDIA/GC NAA, CONFIRMATION
Chlamydia trachomatis, NAA: NEGATIVE
Neisseria gonorrhoeae, NAA: NEGATIVE

## 2023-07-08 LAB — GONOCOCCUS CULTURE

## 2023-08-31 ENCOUNTER — Ambulatory Visit: Admission: EM | Admit: 2023-08-31 | Discharge: 2023-08-31 | Disposition: A | Discharge status: Home / Self Care

## 2023-08-31 ENCOUNTER — Encounter: Payer: Self-pay | Admitting: Emergency Medicine

## 2023-08-31 DIAGNOSIS — S46911A Strain of unspecified muscle, fascia and tendon at shoulder and upper arm level, right arm, initial encounter: Secondary | ICD-10-CM | POA: Diagnosis not present

## 2023-08-31 DIAGNOSIS — S4991XA Unspecified injury of right shoulder and upper arm, initial encounter: Secondary | ICD-10-CM

## 2023-08-31 DIAGNOSIS — M25511 Pain in right shoulder: Secondary | ICD-10-CM | POA: Diagnosis not present

## 2023-08-31 NOTE — ED Triage Notes (Signed)
 On 08/22/23 pt injured his right shoulder lifting a crate of cabbage over his head. He file a WC claim and was seen by their providers. He was placed in a sling that he just removed today. He was advised by his employer that his claim was denied and had to be seen on his own in order to return to work. Pt denies any pain he just has pressure in his shoulder when he raises his arm.

## 2023-08-31 NOTE — ED Provider Notes (Signed)
 [facility]    CSN: [id number] Arrival date & time: 08/31/23  1227      History   Chief Complaint Chief Complaint  Patient presents with   Shoulder Injury    HPI [name] [name] is a 23 y.o. male.   23 year old male pt, [name] [name], presents to urgent care for evaluation of right shoulder pain after 08/22/23 injury at work. 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 Pt states he still has "pressure" in his right arm and "pain is ~2-3/10" depending on movement, is worse with overhead extension of right arm. Pt states he has been wearing a sling and was supposed to have had follow up appt with WC today but appt was cancelled by [facility] due to claim denial. Pt requesting note to return to full duty.   Pt is right hand dominant  The history is provided by the patient. No language interpreter was used.    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 Authorizing Provider  azithromycin  (ZITHROMAX  Z-PAK) 250 MG tablet Take 1 tablet (250 mg total) by mouth daily. Take 2 tablets on the first day and then 1 tablet daily thereafter for a total of 5 days of treatment. Patient not taking: Reported on 07/03/2023 04/16/21   Kent Pear, NP  benzonatate   (TESSALON ) 100 MG capsule Take 2 capsules (200 mg total) by mouth every 8 (eight) hours. Patient not taking: Reported on 07/03/2023 04/16/21   Kent Pear, NP  divalproex  (DEPAKOTE  ER) 500 MG 24 hr tablet Take 1 tablet (500 mg total) by mouth at bedtime. 03/18/15  Yes Lyndol Santee, NP  ipratropium (ATROVENT ) 0.06 % nasal spray Place 2 sprays into both nostrils 4 (four) times daily. Patient not taking: Reported on 07/03/2023 06/11/21   Kent Pear, NP  oseltamivir  (TAMIFLU ) 75 MG capsule Take 1 capsule (75 mg total) by mouth every 12 (twelve) hours. Patient not taking: Reported on 07/03/2023 04/16/21   Kent Pear, NP  promethazine -dextromethorphan (PROMETHAZINE -DM) 6.25-15 MG/5ML syrup Take 5 mLs by mouth 4 (four) times daily as needed. Patient not taking: Reported on 07/03/2023 04/16/21   Kent Pear, NP    Family History Family History  Problem Relation Age of Onset   Liver disease Paternal Grandfather        Died at 49   Seizures Maternal Uncle        Maternal great uncle    Social History Social History   Tobacco Use   Smoking status: Never   Smokeless tobacco: Never   Tobacco comments:    doesn't smoke cigarrettes  Vaping Use   Vaping status: Every Day  Substance Use Topics   Alcohol use:  No   Drug use: Yes    Types: Marijuana    Comment: occas.social use     Allergies   Cephalexin, Clindamycin, Amoxicillin, and Penicillins   Review of Systems Review of Systems  Musculoskeletal:  Positive for arthralgias and myalgias. Negative for joint swelling.  Skin: Negative.   All other systems reviewed and are negative.    Physical Exam Triage Vital Signs ED Triage Vitals  Encounter Vitals Group     BP      Systolic BP Percentile      Diastolic BP Percentile      Pulse      Resp      Temp      Temp src      SpO2      Weight      Height      Head Circumference      Peak Flow      Pain Score      Pain Loc      Pain Education      Exclude from Growth Chart     No data found.  Updated Vital Signs BP 107/70 (BP Location: Right Arm)   Pulse 69   Temp 98 F (36.7 C) (Oral)   Resp 18   SpO2 96%   Visual Acuity Right Eye Distance:   Left Eye Distance:   Bilateral Distance:    Right Eye Near:   Left Eye Near:    Bilateral Near:     Physical Exam Vitals and nursing note reviewed.  Constitutional:      General: He is not in acute distress.    Appearance: He is well-developed and well-groomed.  HENT:     Head: Normocephalic and atraumatic.  Eyes:     Conjunctiva/sclera: Conjunctivae normal.  Cardiovascular:     Rate and Rhythm: Normal rate and regular rhythm.     Pulses:          Radial pulses are 2+ on the right side.     Heart sounds: No murmur heard. Pulmonary:     Effort: Pulmonary effort is normal. No respiratory distress.  Abdominal:     Palpations: Abdomen is soft.     Tenderness: There is no abdominal tenderness.  Musculoskeletal:        General: No swelling.     Right shoulder: Tenderness present. Decreased range of motion. Normal pulse.     Cervical back: Neck supple.     Comments: Pt has pain in anterior AC right shoulder and right trapezius, reports pain/pressure with extension, palpation and movement.  Skin:    General: Skin is warm and dry.     Capillary Refill: Capillary refill takes less than 2 seconds.  Neurological:     General: No focal deficit present.     Mental Status: He is alert and oriented to person, place, and time.     GCS: GCS eye subscore is 4. GCS verbal subscore is 5. GCS motor subscore is 6.     Cranial Nerves: Cranial nerves 2-12 are intact.     Sensory: Sensation is intact.  Psychiatric:        Attention and Perception: Attention normal.        Mood and Affect: Mood normal.        Speech: Speech normal.        Behavior: Behavior normal. Behavior is cooperative.      UC Treatments / Results  Labs (all labs ordered are listed, but only abnormal results  are displayed) Labs Reviewed -  No data to display  EKG   Radiology No results found.  Procedures Procedures (including critical care time)  Medications Ordered in UC Medications - No data to display  Initial Impression / Assessment and Plan / UC Course  I have reviewed the triage vital signs and the nursing notes.  Pertinent labs & imaging results that were available during my care of the patient were reviewed by me and considered in my medical decision making (see chart for details).    Discussed exam findings and plan of care with patient, referred to [facility]) patient verbalized understanding to this provider, will follow-up with orthopedics. Pt aware we are unable to clear at Urgent care as pt is still in pain.   Ddx: Right shoulder injury, right shoulder strain, pain in right shoulder joint, Ac separation/tear Final Clinical Impressions(s) / UC Diagnoses   Final diagnoses:  Right shoulder injury, initial encounter  Right shoulder strain, initial encounter  Pain in joint of right shoulder     Discharge Instructions      Please follow up with [facility](walk in) or PCP for further evaluation of right shoulder injury:   [facility]: [address] [place], [place] [address] Phone: [phone] Urgent care hours 8a-7:30p Mon-Sat  We are unable to clear you to return to work with continued pressure of right shoulder joint. May take otc meds of your choice for pain(lidocaine gel,biofreeze, tylenol , etc as label directed) Rest,ice,wear sling for comfort      ED Prescriptions   None    PDMP not reviewed this encounter.   Peter Brands, NP 08/31/23 1556

## 2023-08-31 NOTE — Discharge Instructions (Addendum)
 Please follow up with [facility](walk in) or PCP for further evaluation of right shoulder injury:   [facility]: [address] [place], [place] [address] Phone: [phone] Urgent care hours 8a-7:30p Mon-Sat  We are unable to clear you to return to work with continued pressure of right shoulder joint. May take otc meds of your choice for pain(lidocaine gel,biofreeze, tylenol , etc as label directed) Rest,ice,wear sling for comfort

## 2023-09-09 ENCOUNTER — Ambulatory Visit: Admission: EM | Admit: 2023-09-09 | Discharge: 2023-09-09 | Disposition: A | Discharge status: Home / Self Care

## 2023-09-09 ENCOUNTER — Encounter: Payer: Self-pay | Admitting: Intensive Care

## 2023-09-09 ENCOUNTER — Other Ambulatory Visit: Payer: Self-pay

## 2023-09-09 ENCOUNTER — Emergency Department

## 2023-09-09 ENCOUNTER — Emergency Department: Admission: EM | Admit: 2023-09-09 | Discharge: 2023-09-09 | Disposition: A | Discharge status: Home / Self Care

## 2023-09-09 DIAGNOSIS — N202 Calculus of kidney with calculus of ureter: Secondary | ICD-10-CM | POA: Insufficient documentation

## 2023-09-09 DIAGNOSIS — R0781 Pleurodynia: Secondary | ICD-10-CM | POA: Diagnosis not present

## 2023-09-09 DIAGNOSIS — R110 Nausea: Secondary | ICD-10-CM

## 2023-09-09 DIAGNOSIS — R1031 Right lower quadrant pain: Secondary | ICD-10-CM | POA: Diagnosis not present

## 2023-09-09 DIAGNOSIS — R1011 Right upper quadrant pain: Secondary | ICD-10-CM | POA: Diagnosis present

## 2023-09-09 DIAGNOSIS — R109 Unspecified abdominal pain: Secondary | ICD-10-CM

## 2023-09-09 DIAGNOSIS — N201 Calculus of ureter: Secondary | ICD-10-CM | POA: Diagnosis not present

## 2023-09-09 DIAGNOSIS — D72819 Decreased white blood cell count, unspecified: Secondary | ICD-10-CM | POA: Insufficient documentation

## 2023-09-09 DIAGNOSIS — N1330 Unspecified hydronephrosis: Secondary | ICD-10-CM | POA: Diagnosis not present

## 2023-09-09 LAB — CBC
HCT: 43.8 % (ref 39.0–52.0)
Hemoglobin: 14.7 g/dL (ref 13.0–17.0)
MCH: 31.2 pg (ref 26.0–34.0)
MCHC: 33.6 g/dL (ref 30.0–36.0)
MCV: 93 fL (ref 80.0–100.0)
Platelets: 164 10*3/uL (ref 150–400)
RBC: 4.71 MIL/uL (ref 4.22–5.81)
RDW: 13 % (ref 11.5–15.5)
WBC: 3.5 10*3/uL — ABNORMAL LOW (ref 4.0–10.5)
nRBC: 0 % (ref 0.0–0.2)

## 2023-09-09 LAB — URINALYSIS, ROUTINE W REFLEX MICROSCOPIC
Bilirubin Urine: NEGATIVE
Glucose, UA: NEGATIVE mg/dL
Ketones, ur: NEGATIVE mg/dL
Leukocytes,Ua: NEGATIVE
Nitrite: NEGATIVE
Protein, ur: NEGATIVE mg/dL
RBC / HPF: >50 RBC/hpf (ref 0–5)
Specific Gravity, Urine: 1.005 (ref 1.005–1.030)
Squamous Epithelial / HPF: 0 /HPF (ref 0–5)
pH: 6 (ref 5.0–8.0)

## 2023-09-09 LAB — COMPREHENSIVE METABOLIC PANEL WITH GFR
ALT: 24 U/L (ref 0–44)
AST: 24 U/L (ref 15–41)
Albumin: 4.3 g/dL (ref 3.5–5.0)
Alkaline Phosphatase: 42 U/L (ref 38–126)
Anion gap: 8 (ref 5–15)
BUN: 10 mg/dL (ref 6–20)
CO2: 27 mmol/L (ref 22–32)
Calcium: 9.6 mg/dL (ref 8.9–10.3)
Chloride: 103 mmol/L (ref 98–111)
Creatinine, Ser: 0.84 mg/dL (ref 0.61–1.24)
GFR, Estimated: >60 mL/min (ref >60)
Glucose, Bld: 96 mg/dL (ref 70–99)
Potassium: 4.1 mmol/L (ref 3.5–5.1)
Sodium: 138 mmol/L (ref 135–145)
Total Bilirubin: 0.9 mg/dL (ref 0.0–1.2)
Total Protein: 7.9 g/dL (ref 6.5–8.1)

## 2023-09-09 LAB — LIPASE, BLOOD: Lipase: 89 U/L — ABNORMAL HIGH (ref 11–51)

## 2023-09-09 MED ORDER — KETOROLAC TROMETHAMINE 15 MG/ML IJ SOLN
15.0000 mg | Freq: Once | INTRAMUSCULAR | Status: AC
  Administered 2023-09-09: 15 mg via INTRAVENOUS
  Filled 2023-09-09: qty 1

## 2023-09-09 MED ORDER — TAMSULOSIN HCL 0.4 MG PO CAPS: 0.4000 mg | ORAL_CAPSULE | Freq: Every day | ORAL | 0 refills | Status: AC

## 2023-09-09 MED ORDER — IBUPROFEN 800 MG PO TABS: 800.0000 mg | ORAL_TABLET | Freq: Three times a day (TID) | ORAL | 0 refills | Status: AC | PRN

## 2023-09-09 MED ORDER — IOHEXOL 300 MG/ML  SOLN
80.0000 mL | Freq: Once | INTRAMUSCULAR | Status: AC | PRN
  Administered 2023-09-09: 80 mL via INTRAVENOUS

## 2023-09-09 MED ORDER — ACETAMINOPHEN 500 MG PO TABS: 1000.0000 mg | ORAL_TABLET | Freq: Four times a day (QID) | ORAL | 2 refills | Status: AC | PRN

## 2023-09-09 MED ORDER — ACETAMINOPHEN 500 MG PO TABS
1000.0000 mg | ORAL_TABLET | Freq: Once | ORAL | Status: AC
Start: 2023-09-09 — End: 2023-09-09
  Administered 2023-09-09: 1000 mg via ORAL
  Filled 2023-09-09: qty 2

## 2023-09-09 MED ORDER — ONDANSETRON HCL 4 MG/2ML IJ SOLN
4.0000 mg | Freq: Once | INTRAMUSCULAR | Status: AC
  Administered 2023-09-09: 4 mg via INTRAVENOUS
  Filled 2023-09-09: qty 2

## 2023-09-09 NOTE — ED Provider Notes (Signed)
 [facility]    CSN: [id number] Arrival date & time: 09/09/23  1515      History   Chief Complaint Chief Complaint  Patient presents with   Abdominal Pain    HPI [name] [name] is a 23 y.o. male presenting for 1 hour history of right upper abdominal pain with the right flank.  He was DOES not want pain started in the right upper quadrant.  Pain is worse with breathing and turning.  He denies any sort of injury.  He reports the pain is constant but he has waves of more intense and severe pain associated with sweats, chills, nausea without vomiting.  Denies fever, cough, congestion, chest pain, shortness of breath, palpitations, diarrhea, constipation, dysuria, frequency, urgency, hematuria.  No history of kidney stones.  No history of DVT or PE.  No recent surgeries.  Has not taken anything for pain relief.  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 Authorizing Provider  azithromycin  (ZITHROMAX  Z-PAK) 250 MG tablet Take 1 tablet (250 mg total) by mouth daily. Take 2 tablets on the first day and then 1 tablet daily thereafter for a total of 5 days of treatment. Patient not taking: Reported on 07/03/2023 04/16/21   Kent Pear, NP  benzonatate  (TESSALON ) 100 MG capsule Take 2 capsules (200 mg total) by mouth every 8 (eight) hours. Patient not taking: Reported on 07/03/2023 04/16/21    Kent Pear, NP  divalproex  (DEPAKOTE  ER) 500 MG 24 hr tablet Take 1 tablet (500 mg total) by mouth at bedtime. 03/18/15  Yes Lyndol Santee, NP  ipratropium (ATROVENT ) 0.06 % nasal spray Place 2 sprays into both nostrils 4 (four) times daily. Patient not taking: Reported on 07/03/2023 06/11/21   Kent Pear, NP  oseltamivir  (TAMIFLU ) 75 MG capsule Take 1 capsule (75 mg total) by mouth every 12 (twelve) hours. Patient not taking: Reported on 07/03/2023 04/16/21   Kent Pear, NP  promethazine -dextromethorphan (PROMETHAZINE -DM) 6.25-15 MG/5ML syrup Take 5 mLs by mouth 4 (four) times daily as needed. Patient not taking: Reported on 07/03/2023 04/16/21   Kent Pear, NP    Family History Family History  Problem Relation Age of Onset   Liver disease Paternal Grandfather        Died at 37   Seizures Maternal Uncle        Maternal great uncle    Social History Social History   Tobacco Use   Smoking status: Never   Smokeless tobacco: Never   Tobacco comments:    doesn't smoke cigarrettes  Vaping Use   Vaping status: Every Day  Substance Use Topics   Alcohol use: No   Drug use: Yes    Types: Marijuana    Comment: occas.social use     Allergies   Cephalexin, Clindamycin, Amoxicillin, and Penicillins   Review of Systems Review  of Systems  Constitutional:  Negative for fatigue and fever.  HENT:  Negative for congestion.   Respiratory:  Negative for cough and shortness of breath.   Cardiovascular:  Negative for chest pain.  Gastrointestinal:  Positive for abdominal pain and nausea. Negative for constipation, diarrhea and vomiting.  Genitourinary:  Positive for flank pain. Negative for difficulty urinating, frequency and hematuria.  Musculoskeletal:  Positive for back pain.  Neurological:  Negative for weakness.     Physical Exam Triage Vital Signs ED Triage Vitals  Encounter Vitals Group     BP 09/09/23 1525 (!) 126/90     Systolic BP Percentile --      Diastolic BP  Percentile --      Pulse Rate 09/09/23 1525 61     Resp --      Temp 09/09/23 1525 97.8 F (36.6 C)     Temp Source 09/09/23 1525 Oral     SpO2 09/09/23 1525 98 %     Weight 09/09/23 1523 160 lb (72.6 kg)     Height 09/09/23 1523 5\' 9"  (1.753 m)     Head Circumference --      Peak Flow --      Pain Score 09/09/23 1522 10     Pain Loc --      Pain Education --      Exclude from Growth Chart --    No data found.  Updated Vital Signs BP (!) 126/90 (BP Location: Left Arm)   Pulse 61   Temp 97.8 F (36.6 C) (Oral)   Ht 5\' 9"  (1.753 m)   Wt 160 lb (72.6 kg)   SpO2 98%   BMI 23.63 kg/m     Physical Exam Vitals and nursing note reviewed.  Constitutional:      General: He is not in acute distress.    Appearance: Normal appearance. He is well-developed. He is not ill-appearing.     Comments: Patient is holding his right ribs  HENT:     Head: Normocephalic and atraumatic.  Eyes:     General: No scleral icterus.    Conjunctiva/sclera: Conjunctivae normal.  Cardiovascular:     Rate and Rhythm: Normal rate and regular rhythm.     Heart sounds: Normal heart sounds.  Pulmonary:     Effort: Pulmonary effort is normal. No respiratory distress.     Breath sounds: Normal breath sounds.  Chest:     Chest wall: Tenderness (right ribs) present.  Abdominal:     Palpations: Abdomen is soft.     Tenderness: There is abdominal tenderness (mild right upper quadrant.). There is right CVA tenderness. There is no left CVA tenderness.  Musculoskeletal:     Cervical back: Neck supple.  Skin:    General: Skin is warm and dry.     Capillary Refill: Capillary refill takes less than 2 seconds.  Neurological:     General: No focal deficit present.     Mental Status: He is alert. Mental status is at baseline.     Motor: No weakness.     Gait: Gait normal.  Psychiatric:        Mood and Affect: Mood normal.        Behavior: Behavior normal.      UC Treatments / Results  Labs (all labs  ordered are listed, but only abnormal results are displayed) Labs Reviewed - No data to display  EKG   Radiology No results found.  Procedures Procedures (including critical care time)  Medications Ordered in UC Medications - No data to display  Initial Impression / Assessment and Plan / UC Course  I have reviewed the triage vital signs and the nursing notes.  Pertinent labs & imaging results that were available during my care of the patient were reviewed by me and considered in my medical decision making (see chart for details).   23 year old male presents for right upper abdominal pain with radiation to the right flank, intermittent sharp and stabbing pains, chills, sweats and nausea without vomiting for the past 1 hour.  Pain is worse with movement and breathing.  Denies fever, cough, congestion, shortness of breath, urinary symptoms or change in bowel habits.  Patient is afebrile.  Does not appear ill.  He is uncomfortable.  Holding his right ribs during triage.  On exam he has tenderness of the right ribs, right upper quadrant without guarding.  Right CVA tenderness appears greater than right upper quadrant tenderness.  No tenderness of the right lower quadrant.  Chest clear and 4 lung fields.  Heart regular rate and rhythm.  DDX: kidney stone, pyelonephritis, UTI, muscle strain/spasm, appendicitis, pneumonia, PE, pneumothorax, cholelithiasis/cholecystitis.  Patient with potential condition that may cause threat to life or bodily harm.  Highest suspicion for kidney stone.  Advised patient of capabilities of urgent care including x-ray of chest to evaluate for pneumothorax, pneumonia, KUB to assess for possible kidney stone (but explained x-ray does not see all stones), urinalysis to check for urinary tract infection and lab work.  Explained that we do not have CT capability so if his workup here is negative he would be advised to go to the emergency department for further  evaluation.  Patient says that his pain is too significant and he would like to just proceed straight to the emergency department.  He declines EMS transport.  Reports that he has a ride they will take him to [facility].  He is living in stable condition.  *** Final Clinical Impressions(s) / UC Diagnoses   Final diagnoses:  Right flank pain  Pleuritic pain  Nausea without vomiting   Discharge Instructions      You have been advised to follow up immediately in the emergency department for concerning signs.symptoms. If you declined EMS transport, please have a family member take you directly to the ED at this time. Do not delay. Based on concerns about condition, if you do not follow up in th e ED, you may risk poor outcomes including worsening of condition, delayed treatment and potentially life threatening issues. If you have declined to go to the ED at this time, you should call your PCP immediately to set up a follow up appointment.  Go to ED for red flag symptoms, including; fevers you cannot reduce with Tylenol /Motrin, severe headaches, vision changes, numbness/weakness in part of the body, lethargy, confusion, intractable vomiting, severe dehydration, chest pain, breathing difficulty, severe persistent abdominal or pelvic pain, signs of severe infection (increased redness, swelling of an area), feeling faint or passing out, dizziness, etc. You should especially go to the ED for sudden acute worsening of condition if you do not elect to go at this time.   ED Prescriptions   None    PDMP not reviewed this encounter.

## 2023-09-09 NOTE — ED Provider Notes (Signed)
-----------------------------------------   7:02 PM on 09/09/2023 -----------------------------------------  Blood pressure (!) 139/90, pulse (!) 55, temperature 98 F (36.7 C), temperature source Oral, resp. rate 18, height 5\' 9"  (1.753 m), weight 72.6 kg, SpO2 100%.  Assuming care from Dr. Cam Cava.  In short, [name] [name] is a 23 y.o. male with a chief complaint of Abdominal Pain .  Refer to the original H&P for additional details.  The current plan of care is to await CT abdomen and pelvis and make appropriate disposition.  ----------------------------------------- 8:30 PM on 09/09/2023 -----------------------------------------  Updated patient on CT results revealing a 2 mm distal right ureteral calculus.  Patient encouraged to follow-up with urology.  Patient is in stable condition at discharge.  Clinical Course as of 09/09/23 2029  Sat Sep 09, 2023  1628 CBC with mild leukopenia, stable compared to prior [MM]  1657 Lipase mildly elevated, nonspecific CMP reviewed, unremarkable [MM]  1714 Urinalysis reviewed, significant hematuria, no evidence of infection. [MM]  1809 Patient reevaluated, pain has resolved  CT results pending, concerning for small R urolithiasis at UVJ on my interpretation [MM]  2023 CT ABDOMEN PELVIS W CONTRAST 2 mm distal right ureteral calculus.   [MH]    Clinical Course User Index [MH] Billye Buerger, PA-C [MM] Collis Deaner, MD      Alvaro Augusta A, PA-C 09/09/23 2031    Collis Deaner, MD 09/10/23 Larita Pluck

## 2023-09-09 NOTE — ED Triage Notes (Signed)
 Pt c/o pain in his lower right abdomen that has moved to his lower back x1day  Pt states that he had a bowel movement today and felt pain after using the bathroom.   Pt denies any urinary frequency, blood in urine, or urgency  Pt states the pain comes and goes and is a sharp 10/10 pain

## 2023-09-09 NOTE — Discharge Instructions (Signed)

## 2023-09-09 NOTE — ED Provider Notes (Signed)
 [facility] Provider Note    Event Date/Time   First MD Initiated Contact with Patient 09/09/23 1619     (approximate)   History   Abdominal Pain  Patient presents with right sided abdominal pain that now he feels in right, lower back. Reports sharp pain and nausea. Started a few hours ago   Denies fever and diarrhea  Denies urinary symptoms   HPI [name] [name] is a 23 y.o. male with no related past medical history presents for for right-sided abdominal pain - Seen in urgent care earlier today, sent to ED for further eval - Present since this morning.  Primarily in right mid abdomen and right flank.  Notes a lot of pain when going over bumps on the road to hospital. -Urinated and had bowel movement today, no associated symptoms -No abdominal surgical history -Has not yet taken anything for pain -Last p.o. intake greater than 8 hours -No preceding trauma -No testicular pain/swelling, no urinary symptoms     Physical Exam   Triage Vital Signs: ED Triage Vitals [09/09/23 1612]  Encounter Vitals Group     BP (!) 139/90     Systolic BP Percentile      Diastolic BP Percentile      Pulse Rate (!) 55     Resp 18     Temp 98 F (36.7 C)     Temp Source Oral     SpO2 100 %     Weight 160 lb (72.6 kg)     Height 5\' 9"  (1.753 m)     Head Circumference      Peak Flow      Pain Score 10     Pain Loc      Pain Education      Exclude from Growth Chart     Most recent vital signs: Vitals:   09/09/23 1612  BP: (!) 139/90  Pulse: (!) 55  Resp: 18  Temp: 98 F (36.7 C)  SpO2: 100%     General: Awake, tearful but nontoxic-appearing CV:  Good peripheral perfusion.  Mild bradycardia, RP 2+ Resp:  Normal effort. CTAB Abd:  No distention. Mod TTP RUQ and RLQ, significant R CVAT.  No left-sided abdominal tenderness, no left-sided CVA tenderness.    ED Results / Procedures / Treatments   Labs (all labs ordered are listed, but only abnormal  results are displayed) Labs Reviewed  LIPASE, BLOOD - Abnormal; Notable for the following components:      Result Value   Lipase 89 (*)    All other components within normal limits  CBC - Abnormal; Notable for the following components:   WBC 3.5 (*)    All other components within normal limits  URINALYSIS, ROUTINE W REFLEX MICROSCOPIC - Abnormal; Notable for the following components:   Color, Urine STRAW (*)    APPearance CLEAR (*)    Hgb urine dipstick LARGE (*)    Bacteria, UA RARE (*)    All other components within normal limits  COMPREHENSIVE METABOLIC PANEL WITH GFR     EKG  N/a   RADIOLOGY CT interpreted by myself, apparent small urolithiasis of right UVJ, formal radiology read pending.  No significant hydronephrosis appreciated.   PROCEDURES:  Critical Care performed: No  Procedures   MEDICATIONS ORDERED IN ED: Medications  ketorolac (TORADOL) 15 MG/ML injection 15 mg (has no administration in time range)  acetaminophen  (TYLENOL ) tablet 1,000 mg (1,000 mg Oral Given 09/09/23 1702)  ketorolac (TORADOL)  15 MG/ML injection 15 mg (15 mg Intravenous Given 09/09/23 1702)  ondansetron (ZOFRAN) injection 4 mg (4 mg Intravenous Given 09/09/23 1703)  iohexol (OMNIPAQUE) 300 MG/ML solution 80 mL (80 mLs Intravenous Contrast Given 09/09/23 1724)     IMPRESSION / MDM / ASSESSMENT AND PLAN / ED COURSE  I reviewed the triage vital signs and the nursing notes.                              DDX/MDM/AP: Differential diagnosis includes, but is not limited to, urolithiasis, appendicitis, pyelonephritis, cholecystitis.  Doubt perforated viscus, intussusception.  Consider MSK strain.  No history to suggest testicular pathology.  Plan: - N.p.o. - Pain control - labs - CT abdomen pelvis - Reassess  Patient's presentation is most consistent with acute presentation with potential threat to life or bodily function.  ED course below.  Laboratory workup notable for hematuria,  otherwise unremarkable.  No evidence of underlying UTI, no AKI.  Patient feeling much better after Toradol.  CT abdomen pelvis with possible small right-sided UVJ urolithiasis on my read, formal radiology read pending.  Signed out to my ED provider colleague Alvaro Augusta, PA-C pending formal CT read.  Tentatively plan for Flomax, Tylenol , Motrin with urology outpatient follow-up assuming radiology interpretation in agreement.  Counseled on oral hydration.  Patient agrees with plan.  Clinical Course as of 09/09/23 1912  Sat Sep 09, 2023  1628 CBC with mild leukopenia, stable compared to prior [MM]  1657 Lipase mildly elevated, nonspecific CMP reviewed, unremarkable [MM]  1714 Urinalysis reviewed, significant hematuria, no evidence of infection. [MM]  1809 Patient reevaluated, pain has resolved  CT results pending, concerning for small R urolithiasis at UVJ on my interpretation [MM]    Clinical Course User Index [MM] Collis Deaner, MD     FINAL CLINICAL IMPRESSION(S) / ED DIAGNOSES   Final diagnoses:  Right flank pain  Calculus of ureter     Rx / DC Orders   ED Discharge Orders          Ordered    tamsulosin (FLOMAX) 0.4 MG CAPS capsule  Daily        09/09/23 1904    acetaminophen  (TYLENOL ) 500 MG tablet  Every 6 hours PRN        09/09/23 1904    ibuprofen (ADVIL) 800 MG tablet  Every 8 hours PRN        09/09/23 1904             Note:  This document was prepared using Dragon voice recognition software and may include unintentional dictation errors.   Collis Deaner, MD 09/09/23 [phone]

## 2023-09-09 NOTE — Discharge Instructions (Addendum)
 Your evaluation in the emergency department was notable for a kidney stone.  I placed a referral for you to follow-up with the urologist, and I have started you on a medication called tamsulosin (Flomax) to help pass the stone.  You can use Tylenol  and Motrin as needed for any ongoing discomfort.  Please follow-up with the urologist and your primary care provider, and return to the emergency department with any new or worsening symptoms including fever, inability to urinate, uncontrollable pain, or any other symptoms concerning to you.

## 2023-09-09 NOTE — ED Triage Notes (Signed)
 Patient presents with right sided abdominal pain that now he feels in right, lower back. Reports sharp pain and nausea. Started a few hours ago   Denies fever and diarrhea  Denies urinary symptoms

## 2023-09-09 NOTE — ED Notes (Signed)
 Patient is being discharged from the Urgent Care and sent to the Emergency Department via private vehicle . Per Nancy Axon, PA, patient is in need of higher level of care due to 10/10 right lower abdominal pain. Patient is aware and verbalizes understanding of plan of care.  Vitals:   09/09/23 1525  BP: (!) 126/90  Pulse: 61  Temp: 97.8 F (36.6 C)  SpO2: 98%

## 2023-09-13 ENCOUNTER — Other Ambulatory Visit (INDEPENDENT_AMBULATORY_CARE_PROVIDER_SITE_OTHER): Payer: Self-pay | Admitting: Family

## 2023-09-14 ENCOUNTER — Other Ambulatory Visit: Payer: Self-pay

## 2023-09-14 ENCOUNTER — Encounter: Payer: Self-pay | Admitting: Emergency Medicine

## 2023-09-14 ENCOUNTER — Emergency Department
Admission: EM | Admit: 2023-09-14 | Discharge: 2023-09-14 | Disposition: A | Discharge status: Home / Self Care | Attending: Emergency Medicine | Admitting: Emergency Medicine

## 2023-09-14 DIAGNOSIS — R109 Unspecified abdominal pain: Secondary | ICD-10-CM | POA: Diagnosis present

## 2023-09-14 DIAGNOSIS — N23 Unspecified renal colic: Secondary | ICD-10-CM | POA: Diagnosis not present

## 2023-09-14 DIAGNOSIS — N200 Calculus of kidney: Secondary | ICD-10-CM | POA: Diagnosis not present

## 2023-09-14 LAB — CBC WITH DIFFERENTIAL/PLATELET
Abs Immature Granulocytes: 0 10*3/uL (ref 0.00–0.07)
Basophils Absolute: 0 10*3/uL (ref 0.0–0.1)
Basophils Relative: 0 %
Eosinophils Absolute: 0 10*3/uL (ref 0.0–0.5)
Eosinophils Relative: 1 %
HCT: 40.4 % (ref 39.0–52.0)
Hemoglobin: 13.9 g/dL (ref 13.0–17.0)
Immature Granulocytes: 0 %
Lymphocytes Relative: 51 %
Lymphs Abs: 1.2 10*3/uL (ref 0.7–4.0)
MCH: 31.5 pg (ref 26.0–34.0)
MCHC: 34.4 g/dL (ref 30.0–36.0)
MCV: 91.6 fL (ref 80.0–100.0)
Monocytes Absolute: 0.3 10*3/uL (ref 0.1–1.0)
Monocytes Relative: 12 %
Neutro Abs: 0.9 10*3/uL — ABNORMAL LOW (ref 1.7–7.7)
Neutrophils Relative %: 36 %
Platelets: 146 10*3/uL — ABNORMAL LOW (ref 150–400)
RBC: 4.41 MIL/uL (ref 4.22–5.81)
RDW: 12.9 % (ref 11.5–15.5)
Smear Review: NORMAL
WBC: 2.4 10*3/uL — ABNORMAL LOW (ref 4.0–10.5)
nRBC: 0 % (ref 0.0–0.2)

## 2023-09-14 LAB — COMPREHENSIVE METABOLIC PANEL WITH GFR
ALT: 17 U/L (ref 0–44)
AST: 20 U/L (ref 15–41)
Albumin: 4.2 g/dL (ref 3.5–5.0)
Alkaline Phosphatase: 38 U/L (ref 38–126)
Anion gap: 8 (ref 5–15)
BUN: 10 mg/dL (ref 6–20)
CO2: 27 mmol/L (ref 22–32)
Calcium: 9.2 mg/dL (ref 8.9–10.3)
Chloride: 103 mmol/L (ref 98–111)
Creatinine, Ser: 0.74 mg/dL (ref 0.61–1.24)
GFR, Estimated: >60 mL/min (ref >60)
Glucose, Bld: 97 mg/dL (ref 70–99)
Potassium: 4 mmol/L (ref 3.5–5.1)
Sodium: 138 mmol/L (ref 135–145)
Total Bilirubin: 1.6 mg/dL — ABNORMAL HIGH (ref 0.0–1.2)
Total Protein: 7.4 g/dL (ref 6.5–8.1)

## 2023-09-14 LAB — URINALYSIS, ROUTINE W REFLEX MICROSCOPIC
Bilirubin Urine: NEGATIVE
Glucose, UA: NEGATIVE mg/dL
Hgb urine dipstick: NEGATIVE
Ketones, ur: 5 mg/dL — AB
Leukocytes,Ua: NEGATIVE
Nitrite: NEGATIVE
Protein, ur: NEGATIVE mg/dL
Specific Gravity, Urine: 1.012 (ref 1.005–1.030)
pH: 6 (ref 5.0–8.0)

## 2023-09-14 LAB — PATHOLOGIST SMEAR REVIEW

## 2023-09-14 MED ORDER — LACTATED RINGERS IV BOLUS
1000.0000 mL | Freq: Once | INTRAVENOUS | Status: AC
  Administered 2023-09-14: 1000 mL via INTRAVENOUS

## 2023-09-14 MED ORDER — KETOROLAC TROMETHAMINE 30 MG/ML IJ SOLN
15.0000 mg | Freq: Once | INTRAMUSCULAR | Status: AC
  Administered 2023-09-14: 15 mg via INTRAVENOUS
  Filled 2023-09-14: qty 1

## 2023-09-14 NOTE — Discharge Instructions (Signed)
 Please take Tylenol  and ibuprofen/Advil for your pain.  It is safe to take them together, or to alternate them every few hours.  Take up to 1000mg  of Tylenol  at a time, up to 4 times per day.  Do not take more than 4000 mg of Tylenol  in 24 hours.  For ibuprofen, take 400-600 mg, 3 - 4 times per day.  Keep taking your Flomax  Return to the ED with any worsening symptoms despite these measures

## 2023-09-14 NOTE — ED Provider Notes (Signed)
 [facility] Provider Note    Event Date/Time   First MD Initiated Contact with Patient 09/14/23 1212     (approximate)   History   Flank Pain   HPI  [name] [name] is a 23 y.o. male who presents to the ED for evaluation of Flank Pain   I review ED visit from 5 days ago where patient was seen for right flank pain and diagnosed with a 2 mm distal right-sided ureteral stone on CT imaging.  No infectious features.  Patient presents to the ED due to continued intermittent pain to his right flank consistent with the kidney stone pain he felt a few days ago.  No fevers, dysuria or novel/alternative pain   Physical Exam   Triage Vital Signs: ED Triage Vitals  Encounter Vitals Group     BP 09/14/23 1202 129/77     Systolic BP Percentile --      Diastolic BP Percentile --      Pulse Rate 09/14/23 1202 96     Resp 09/14/23 1202 17     Temp 09/14/23 1202 97.9 F (36.6 C)     Temp Source 09/14/23 1202 Oral     SpO2 09/14/23 1202 100 %     Weight 09/14/23 1201 160 lb (72.6 kg)     Height 09/14/23 1201 5\' 9"  (1.753 m)     Head Circumference --      Peak Flow --      Pain Score 09/14/23 1201 10     Pain Loc --      Pain Education --      Exclude from Growth Chart --     Most recent vital signs: Vitals:   09/14/23 1202  BP: 129/77  Pulse: 96  Resp: 17  Temp: 97.9 F (36.6 C)  SpO2: 100%    General: Awake, no distress.  CV:  Good peripheral perfusion.  Resp:  Normal effort.  Abd:  No distention.  Soft and benign, no tenderness, guarding or peritoneal features MSK:  No deformity noted.  Neuro:  No focal deficits appreciated. Other:     ED Results / Procedures / Treatments   Labs (all labs ordered are listed, but only abnormal results are displayed) Labs Reviewed  URINALYSIS, ROUTINE W REFLEX MICROSCOPIC  CBC WITH DIFFERENTIAL/PLATELET  COMPREHENSIVE METABOLIC PANEL WITH GFR    EKG   RADIOLOGY   Official radiology report(s): No  results found.  PROCEDURES and INTERVENTIONS:  Procedures  Medications  ketorolac (TORADOL) 30 MG/ML injection 15 mg (15 mg Intravenous Given 09/14/23 1221)  lactated ringers bolus 1,000 mL (1,000 mLs Intravenous New Bag/Given 09/14/23 1221)     IMPRESSION / MDM / ASSESSMENT AND PLAN / ED COURSE  I reviewed the triage vital signs and the nursing notes.  Differential diagnosis includes, but is not limited to, ureteral colic, pyelonephritis or UTI, hydronephrosis, MSK pain  {Patient presents with symptoms of an acute illness or injury that is potentially life-threatening.  Young man presents with intermittent pain after recently diagnosed with a kidney stone, with a benign workup and suitable for outpatient management with urologic follow-up.  Reassuring exam and vitals.  Blood work benign with intact renal function, chronic leukopenia.  Urine with small ketones but no infectious features.  With Toradol and IV fluids his pain is resolved, no emesis or recurrence of pain.  Suitable for outpatient management.  Clinical Course as of 09/14/23 1426  Thu Sep 14, 2023  1242 Reassessed.  Feeling  much better after the Toradol.  We discussed plan of care.  He is agreeable. [DS]  1425 Reassessed.  Remains pain-free.  We discussed workup, expectant management, ED return precautions and urology follow-up [DS]    Clinical Course User Index [DS] Arline Bennett, MD     FINAL CLINICAL IMPRESSION(S) / ED DIAGNOSES   Final diagnoses:  None     Rx / DC Orders   ED Discharge Orders     None        Note:  This document was prepared using Dragon voice recognition software and may include unintentional dictation errors.   Arline Bennett, MD 09/14/23 [phone]

## 2023-09-14 NOTE — ED Triage Notes (Signed)
 Patient to ED via POV for right side flank pain. Dx with kidney stone on 5/10 and still not passed. C/o ongoing pain.

## 2023-09-18 DIAGNOSIS — G40B09 Juvenile myoclonic epilepsy, not intractable, without status epilepticus: Secondary | ICD-10-CM | POA: Diagnosis not present

## 2023-09-18 DIAGNOSIS — R079 Chest pain, unspecified: Secondary | ICD-10-CM | POA: Diagnosis not present

## 2023-09-18 DIAGNOSIS — D72819 Decreased white blood cell count, unspecified: Secondary | ICD-10-CM | POA: Diagnosis not present

## 2023-09-18 DIAGNOSIS — F419 Anxiety disorder, unspecified: Secondary | ICD-10-CM | POA: Diagnosis not present

## 2023-09-18 DIAGNOSIS — F32A Depression, unspecified: Secondary | ICD-10-CM | POA: Diagnosis not present

## 2023-09-18 DIAGNOSIS — Z87442 Personal history of urinary calculi: Secondary | ICD-10-CM | POA: Diagnosis not present

## 2023-10-04 ENCOUNTER — Ambulatory Visit: Admitting: Urology

## 2023-10-05 NOTE — Addendum Note (Signed)
 Addended by: Claudio Mondry on: 10/05/2023 11:59 AM   Modules accepted: Orders

## 2023-11-01 ENCOUNTER — Ambulatory Visit: Admitting: Urology

## 2023-11-01 ENCOUNTER — Encounter: Payer: Self-pay | Admitting: Urology
